# Patient Record
Sex: Female | Born: 2000 | Race: White | Hispanic: No | Marital: Single | Smoking: Never smoker
Health system: Southern US, Community
[De-identification: ages and names within clinical notes are randomized; demographics above are authoritative.]

---
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
## 2019-10-12 IMAGING — US US PELVIS COMPLETE TRANSABD/TRANSVAG
1 series · 13 of 25 positions shown · non-contrast
Comparison: None

CLINICAL DATA: Initial evaluation for vaginal bleeding with pain
for 1 day. New IUD placed 2 weeks ago.

EXAM:
TRANSABDOMINAL AND TRANSVAGINAL ULTRASOUND OF PELVIS
TECHNIQUE: Both transabdominal and transvaginal ultrasound examinations of the
pelvis were performed. Transabdominal technique was performed for
global imaging of the pelvis including uterus, ovaries, adnexal
regions, and pelvic cul-de-sac. It was necessary to proceed with
endovaginal exam following the transabdominal exam to visualize the
uterus and ovaries.

[Series 1: us pelvis complete transabd/transvag · 0.18mm/px · 13 of 67 slices shown]
[im 1/67]
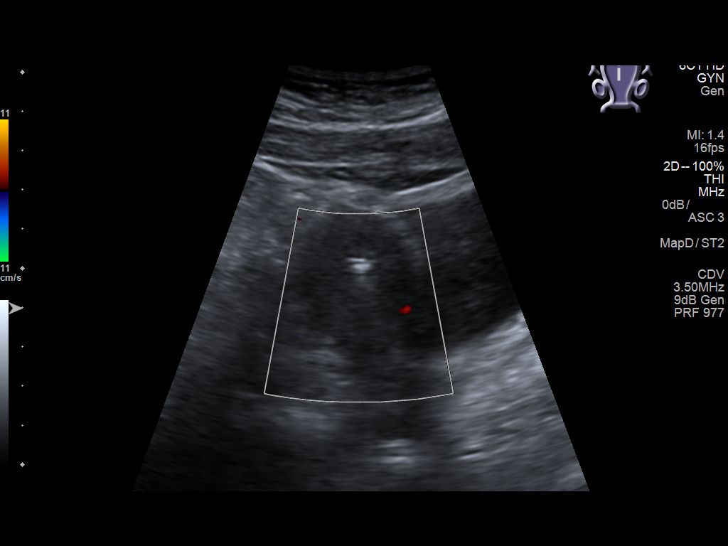
[im 6/67]
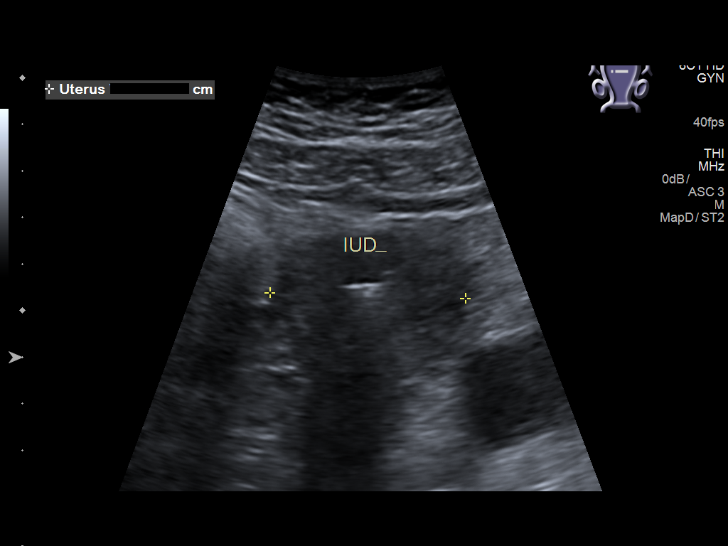
[im 12/67]
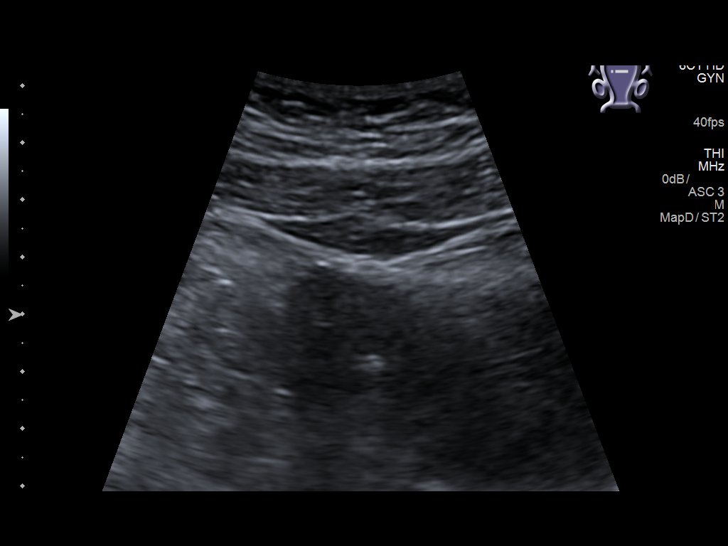
[im 17/67]
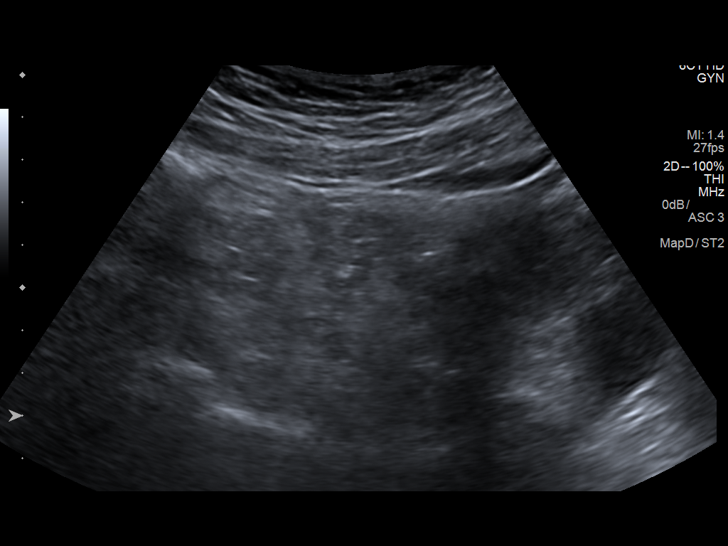
[im 23/67]
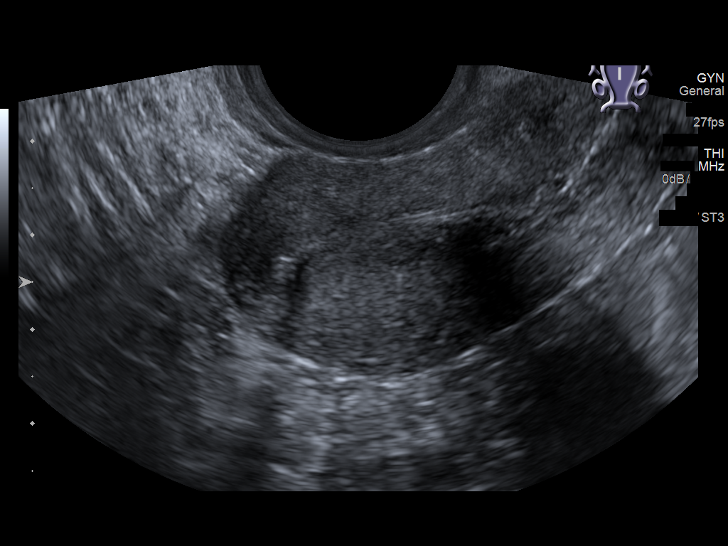
[im 28/67]
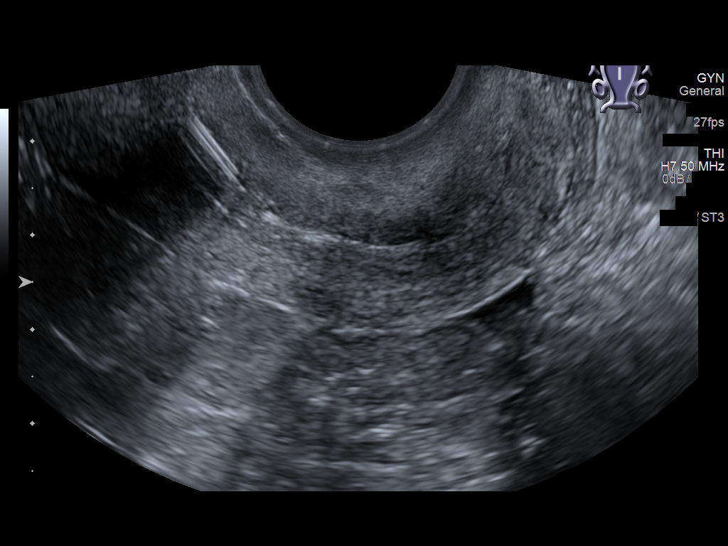
[im 34/67]
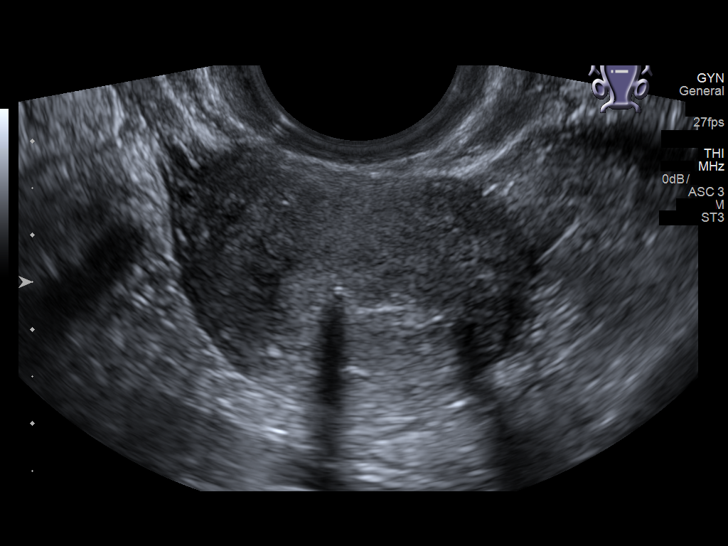
[im 39/67]
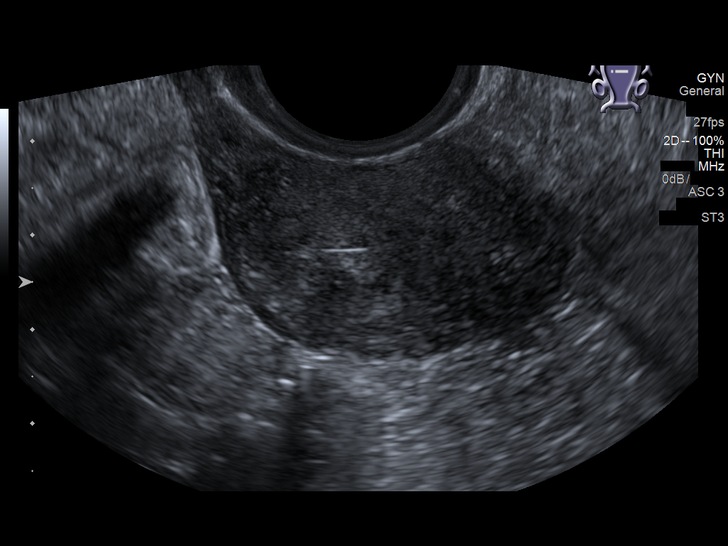
[im 45/67]
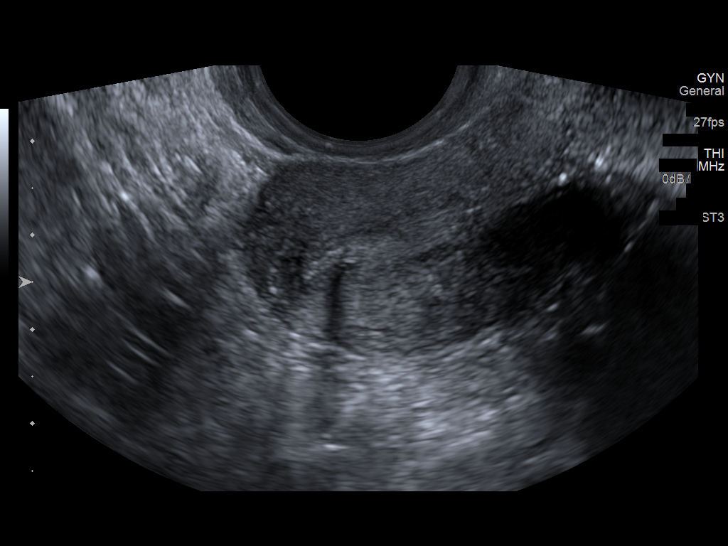
[im 50/67]
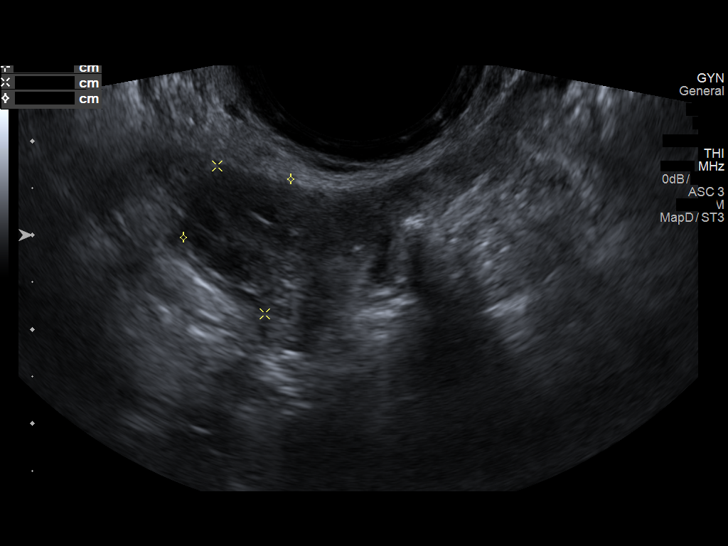
[im 56/67]
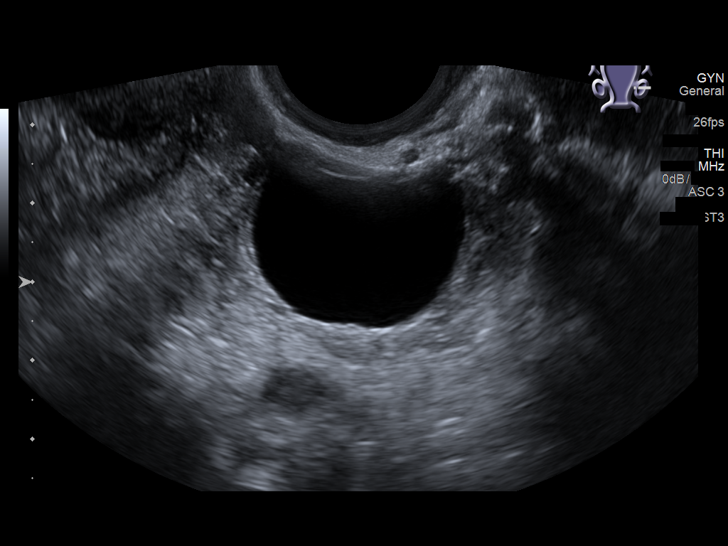
[im 61/67]
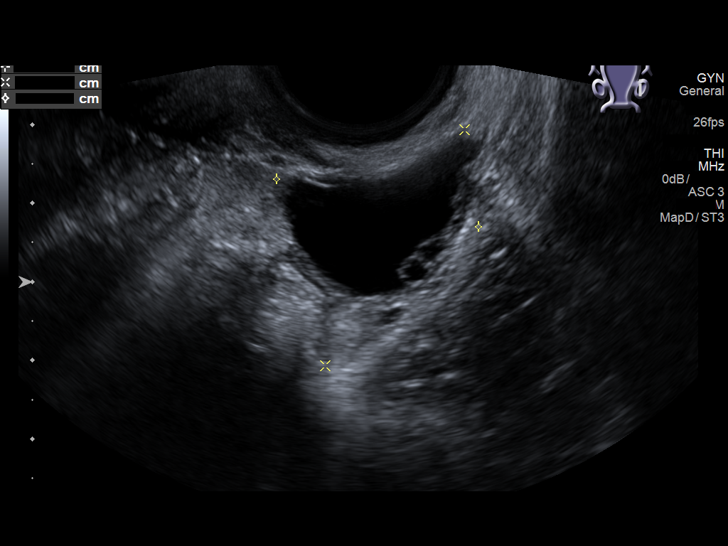
[im 67/67]
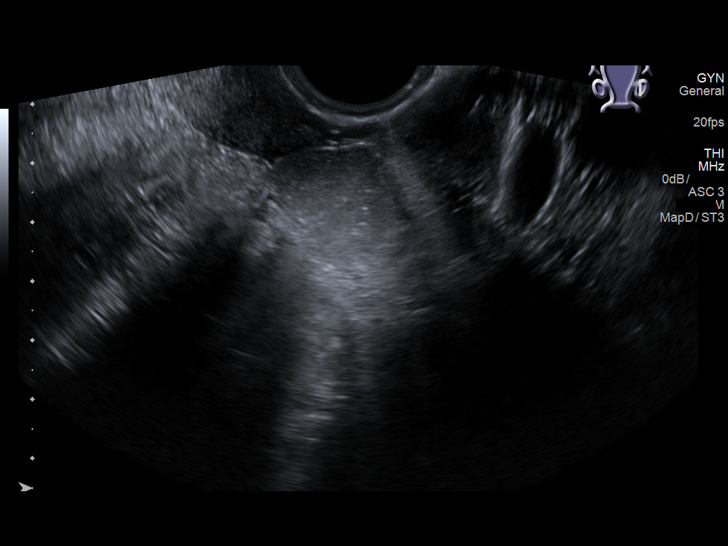

[13 of 25 positions shown; findings below may reference images not displayed]

FINDINGS: Uterus

Measurements: 5.7 x 3.0 x 3.7 cm. No fibroids or other mass
visualized.

Endometrium

Thickness: 3.7 mm. No focal abnormality visualized. IUD is in
appropriate position within the endometrial cavity.

Right ovary

Measurements: 1.7 x 1.3 x 1.5 cm. Normal appearance/no adnexal mass.

Left ovary

Measurements: 3.5 x 2.6 x 3.7 cm. 2.3 x 1.7 x 2.6 cm simple anechoic
cyst, most consistent with a normal physiologic cyst. Few internal
daughter cyst noted.

Other findings

Trace free physiologic fluid within the pelvis.
IMPRESSION: 1. IUD in appropriate position within the endometrial cavity.
Surrounding endometrium normal in appearance.
2. 2.6 cm physiologic left ovarian cyst with associated trace free
physiologic fluid.
3. No other acute abnormality within the pelvis.
